# Patient Record
Sex: Female | Born: 1995 | Race: White | Hispanic: No | Marital: Single | Smoking: Never smoker
Health system: Southern US, Community
[De-identification: ages and names within clinical notes are randomized; demographics above are authoritative.]

## PROBLEM LIST (undated history)

## (undated) DIAGNOSIS — J020 Streptococcal pharyngitis: Secondary | ICD-10-CM

## (undated) DIAGNOSIS — R42 Dizziness and giddiness: Secondary | ICD-10-CM

## (undated) DIAGNOSIS — H8390 Unspecified disease of inner ear, unspecified ear: Secondary | ICD-10-CM

---
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
## 2017-01-16 ENCOUNTER — Emergency Department (HOSPITAL_BASED_OUTPATIENT_CLINIC_OR_DEPARTMENT_OTHER)
Admission: EM | Admit: 2017-01-16 | Discharge: 2017-01-16 | Disposition: A | Discharge status: Home / Self Care | Payer: BLUE CROSS/BLUE SHIELD | Attending: Emergency Medicine | Admitting: Emergency Medicine

## 2017-01-16 ENCOUNTER — Encounter (HOSPITAL_BASED_OUTPATIENT_CLINIC_OR_DEPARTMENT_OTHER): Payer: Self-pay | Admitting: *Deleted

## 2017-01-16 DIAGNOSIS — R6883 Chills (without fever): Secondary | ICD-10-CM | POA: Insufficient documentation

## 2017-01-16 DIAGNOSIS — R6889 Other general symptoms and signs: Secondary | ICD-10-CM

## 2017-01-16 DIAGNOSIS — R51 Headache: Secondary | ICD-10-CM | POA: Insufficient documentation

## 2017-01-16 LAB — URINALYSIS, ROUTINE W REFLEX MICROSCOPIC
Bilirubin Urine: NEGATIVE
GLUCOSE, UA: NEGATIVE mg/dL
Hgb urine dipstick: NEGATIVE
Ketones, ur: NEGATIVE mg/dL
Leukocytes, UA: NEGATIVE
Nitrite: NEGATIVE
Protein, ur: NEGATIVE mg/dL
Specific Gravity, Urine: 1.02 (ref 1.005–1.030)
pH: 6.5 (ref 5.0–8.0)

## 2017-01-16 LAB — PREGNANCY, URINE: PREG TEST UR: NEGATIVE

## 2017-01-16 MED ORDER — IBUPROFEN 800 MG PO TABS: 800.0000 mg | ORAL_TABLET | Freq: Three times a day (TID) | ORAL | 0 refills | Status: DC | PRN

## 2017-01-16 MED ORDER — ONDANSETRON 4 MG PO TBDP: 4.0000 mg | ORAL_TABLET | Freq: Three times a day (TID) | ORAL | 0 refills | Status: AC | PRN

## 2017-01-16 MED ORDER — OSELTAMIVIR PHOSPHATE 75 MG PO CAPS: 75.0000 mg | ORAL_CAPSULE | Freq: Two times a day (BID) | ORAL | 0 refills | Status: AC

## 2017-01-16 MED ORDER — IBUPROFEN 800 MG PO TABS
800.0000 mg | ORAL_TABLET | Freq: Once | ORAL | Status: AC
  Administered 2017-01-16: 800 mg via ORAL
  Filled 2017-01-16: qty 1

## 2017-01-16 NOTE — ED Provider Notes (Signed)
Emergency Department Provider Note   I have reviewed the triage vital signs and the nursing notes.   HISTORY  Chief Complaint Generalized Body Aches   HPI Brianna Watkins is a 21 y.o. female presents to the emergency department for evaluation of generalized body aches, headache, chills started today.  Earlier in the day she was having some right abdomen and flank discomfort but that resolved with Motrin.  Later in the afternoon/evening she began to feel suddenly much worse but denies return of pain in her abdomen or back.  She denies any dysuria, hesitancy, urgency.  She has some mild runny nose but no sore throat.  Her headache is diffuse and moderate.  No neck stiffness.  No productive cough or chest discomfort.   Past Medical History:  Diagnosis Date  . Disorder of inner ear   . Strep throat   . Vertigo     There are no active problems to display for this patient.   History reviewed. No pertinent surgical history.  Current Outpatient Rx  . Order #: [id number] Class: Print  . Order #: [id number] Class: Print  . Order #: [id number] Class: Print    Allergies Bactrim [sulfamethoxazole-trimethoprim]  No family history on file.  Social History Social History   Tobacco Use  . Smoking status: Never Smoker  . Smokeless tobacco: Never Used  Substance Use Topics  . Alcohol use: No  . Drug use: No    Review of Systems  Constitutional: No fever/chills. Positive body aches.  Eyes: No visual changes. ENT: No sore throat. Positive mild congestion.  Cardiovascular: Denies chest pain. Respiratory: Denies shortness of breath. Gastrointestinal: No abdominal pain.  No nausea, no vomiting.  No diarrhea.  No constipation. Genitourinary: Negative for dysuria. Positive flank pain earlier but now resolved.  Musculoskeletal: Negative for back pain. Skin: Negative for rash. Neurological: Negative for focal weakness or numbness. Positive HA.   10-point ROS otherwise  negative.  ____________________________________________   PHYSICAL EXAM:  VITAL SIGNS: ED Triage Vitals  Enc Vitals Group     BP 01/16/17 2027 130/84     Pulse Rate 01/16/17 2027 99     Resp 01/16/17 2027 18     Temp 01/16/17 2027 98.4 F (36.9 C)     Temp Source 01/16/17 2027 Oral     SpO2 01/16/17 2027 100 %     Weight 01/16/17 2024 150 lb (68 kg)     Height 01/16/17 2024 5\' 9"  (1.753 m)     Pain Score 01/16/17 2024 9    Constitutional: Alert and oriented. Well appearing and in no acute distress. Eyes: Conjunctivae are normal.  Head: Atraumatic. Nose: No congestion/rhinnorhea. Mouth/Throat: Mucous membranes are moist.  Oropharynx with mild erythema. No exudate. No PTA.  Neck: No stridor.  No meningeal signs.  Cardiovascular: Normal rate, regular rhythm. Good peripheral circulation. Grossly normal heart sounds.   Respiratory: Normal respiratory effort.  No retractions. Lungs CTAB. Gastrointestinal: Soft and nontender. No distention.  Musculoskeletal: No lower extremity tenderness nor edema. No gross deformities of extremities. Neurologic:  Normal speech and language. No gross focal neurologic deficits are appreciated.  Skin:  Skin is warm, dry and intact. No rash noted.  ____________________________________________   LABS (all labs ordered are listed, but only abnormal results are displayed)  Labs Reviewed  URINALYSIS, ROUTINE W REFLEX MICROSCOPIC  PREGNANCY, URINE   ____________________________________________  RADIOLOGY  None ____________________________________________   PROCEDURES  Procedure(s) performed:   Procedures  None ____________________________________________   INITIAL IMPRESSION / ASSESSMENT AND  PLAN / ED COURSE  Pertinent labs & imaging results that were available during my care of the patient were reviewed by me and considered in my medical decision making (see chart for details).  Patient presents emergency department for evaluation  of generalized body aches, headache, chills, and right abdomen/flank discomfort earlier today that resolved.  Plan for screening urinalysis but patient with no UTI symptoms other than flank pain earlier today.  She has had exposure to the flu as she works at [employer] day[employer] and several children were diagnosed recently.  Clinically this is suspicious for flu.  No meningismus or evidence of other serious bacterial infection.  Patient with no significant respiratory symptoms to necessitate chest x-ray at this time. If UA negative plan for Tamiflu.  Discussed the cost, risks, benefits, and expected course with the patient in detail.  At this time, I do not feel there is any life-threatening condition present. I have reviewed and discussed all results (EKG, imaging, lab, urine as appropriate), exam findings with patient. I have reviewed nursing notes and appropriate previous records.  I feel the patient is safe to be discharged home without further emergent workup. Discussed usual and customary return precautions. Patient and family (if present) verbalize understanding and are comfortable with this plan.  Patient will follow-up with their primary care provider. If they do not have a primary care provider, information for follow-up has been provided to them. All questions have been answered.  ____________________________________________  FINAL CLINICAL IMPRESSION(S) / ED DIAGNOSES  Final diagnoses:  Flu-like symptoms     MEDICATIONS GIVEN DURING THIS VISIT:  Medications  ibuprofen (ADVIL,MOTRIN) tablet 800 mg (800 mg Oral Given 01/16/17 2121)     NEW OUTPATIENT MEDICATIONS STARTED DURING THIS VISIT:  Tamiflu, Zofran, and Motrin 800 mg   Note:  This document was prepared using Dragon voice recognition software and may include unintentional dictation errors.  Alona BeneJoshua Long, MD Emergency Medicine    Long, Arlyss RepressJoshua G, MD 01/17/17 [phone]

## 2017-01-16 NOTE — ED Triage Notes (Signed)
C/o generalized body aches, ha, chills x 1 day   Took ibu 800 mg at 1000 this am

## 2017-01-16 NOTE — Discharge Instructions (Signed)

## 2017-01-17 ENCOUNTER — Encounter (HOSPITAL_BASED_OUTPATIENT_CLINIC_OR_DEPARTMENT_OTHER): Payer: Self-pay

## 2017-01-17 ENCOUNTER — Emergency Department (HOSPITAL_BASED_OUTPATIENT_CLINIC_OR_DEPARTMENT_OTHER)
Admission: EM | Admit: 2017-01-17 | Discharge: 2017-01-17 | Disposition: A | Discharge status: Home / Self Care | Payer: Self-pay | Attending: Emergency Medicine | Admitting: Emergency Medicine

## 2017-01-17 ENCOUNTER — Emergency Department (HOSPITAL_BASED_OUTPATIENT_CLINIC_OR_DEPARTMENT_OTHER): Payer: Self-pay

## 2017-01-17 ENCOUNTER — Other Ambulatory Visit: Payer: Self-pay

## 2017-01-17 DIAGNOSIS — R109 Unspecified abdominal pain: Secondary | ICD-10-CM | POA: Insufficient documentation

## 2017-01-17 DIAGNOSIS — J111 Influenza due to unidentified influenza virus with other respiratory manifestations: Secondary | ICD-10-CM | POA: Insufficient documentation

## 2017-01-17 DIAGNOSIS — R69 Illness, unspecified: Secondary | ICD-10-CM

## 2017-01-17 LAB — CBC WITH DIFFERENTIAL/PLATELET
BASOS PCT: 0 %
Basophils Absolute: 0 10*3/uL (ref 0.0–0.1)
Eosinophils Absolute: 0 10*3/uL (ref 0.0–0.7)
Eosinophils Relative: 0 %
HCT: 36.9 % (ref 36.0–46.0)
HEMOGLOBIN: 12.5 g/dL (ref 12.0–15.0)
LYMPHS PCT: 17 %
Lymphs Abs: 0.5 10*3/uL — ABNORMAL LOW (ref 0.7–4.0)
MCH: 28.5 pg (ref 26.0–34.0)
MCHC: 33.9 g/dL (ref 30.0–36.0)
MCV: 84.2 fL (ref 78.0–100.0)
MONO ABS: 0.5 10*3/uL (ref 0.1–1.0)
Monocytes Relative: 15 %
NEUTROS PCT: 68 %
Neutro Abs: 2.1 10*3/uL (ref 1.7–7.7)
Platelets: 183 10*3/uL (ref 150–400)
RBC: 4.38 MIL/uL (ref 3.87–5.11)
RDW: 12.8 % (ref 11.5–15.5)
WBC: 3.1 10*3/uL — ABNORMAL LOW (ref 4.0–10.5)

## 2017-01-17 LAB — COMPREHENSIVE METABOLIC PANEL
ALT: 23 U/L (ref 14–54)
AST: 32 U/L (ref 15–41)
Albumin: 4.2 g/dL (ref 3.5–5.0)
Alkaline Phosphatase: 53 U/L (ref 38–126)
Anion gap: 8 (ref 5–15)
BILIRUBIN TOTAL: 0.5 mg/dL (ref 0.3–1.2)
BUN: 9 mg/dL (ref 6–20)
CO2: 23 mmol/L (ref 22–32)
Calcium: 8.8 mg/dL — ABNORMAL LOW (ref 8.9–10.3)
Chloride: 105 mmol/L (ref 101–111)
Creatinine, Ser: 0.7 mg/dL (ref 0.44–1.00)
GFR calc Af Amer: >60 mL/min (ref >60)
Glucose, Bld: 106 mg/dL — ABNORMAL HIGH (ref 65–99)
Potassium: 3.6 mmol/L (ref 3.5–5.1)
Sodium: 136 mmol/L (ref 135–145)
Total Protein: 7.3 g/dL (ref 6.5–8.1)

## 2017-01-17 LAB — INFLUENZA PANEL BY PCR (TYPE A & B)
INFLAPCR: POSITIVE — AB
Influenza B By PCR: NEGATIVE

## 2017-01-17 LAB — RAPID STREP SCREEN (MED CTR MEBANE ONLY): Streptococcus, Group A Screen (Direct): NEGATIVE

## 2017-01-17 LAB — I-STAT CG4 LACTIC ACID, ED: Lactic Acid, Venous: 0.88 mmol/L (ref 0.5–1.9)

## 2017-01-17 MED ORDER — SODIUM CHLORIDE 0.9 % IV BOLUS (SEPSIS)
1000.0000 mL | Freq: Once | INTRAVENOUS | Status: AC
  Administered 2017-01-17: 1000 mL via INTRAVENOUS

## 2017-01-17 NOTE — ED Notes (Signed)
Pt not currently in room to be placed on monitor

## 2017-01-17 NOTE — ED Provider Notes (Signed)
Emergency Department Provider Note   I have reviewed the triage vital signs and the nursing notes.   HISTORY  Chief Complaint Generalized Body Aches   HPI Brianna Watkins is a 21 y.o. female with PMH of strep throat to the emergency department for evaluation of flulike symptoms now for the past 2 days.  Patient was seen in the emergency department yesterday with body aches, chills, and brief right sided pain.  Urinalysis was normal yesterday.  The patient has had close sick contacts including individuals diagnosed with flu.  She was prescribed Tamiflu and Motrin but when she went to the pharmacy the Tamiflu was very expensive and she was unable to start taking it.  She has been taking Tylenol and Motrin but today feels worse.  Patient states that she had more symptoms in 2017 which concerns her. No new symptoms such as worsening headache, worsening sore throat, or difficulty breathing.  No chest pain.  Patient did have a very brief sewed of right lower back discomfort but that resolved.  No abdominal pain.    Past Medical History:  Diagnosis Date  . Disorder of inner ear   . Strep throat   . Vertigo     There are no active problems to display for this patient.   History reviewed. No pertinent surgical history.  Current Outpatient Rx  . Order #: [id number] Class: Print  . Order #: [id number] Class: Print  . Order #: [id number] Class: Print    Allergies Bactrim [sulfamethoxazole-trimethoprim]  No family history on file.  Social History Social History   Tobacco Use  . Smoking status: Never Smoker  . Smokeless tobacco: Never Used  Substance Use Topics  . Alcohol use: No  . Drug use: No    Review of Systems  Constitutional: Positive fever/chills Eyes: No visual changes. ENT: Positive sore throat. Cardiovascular: Denies chest pain. Respiratory: Denies shortness of breath.  Gastrointestinal: No abdominal pain. No nausea, no vomiting.  No diarrhea.  No  constipation. Genitourinary: Negative for dysuria. Musculoskeletal: Positive brief episode of lower back pain.  Skin: Negative for rash. Neurological: Negative for focal weakness or numbness. Positive HA.   10-point ROS otherwise negative.  ____________________________________________   PHYSICAL EXAM:  VITAL SIGNS: ED Triage Vitals  Enc Vitals Group     BP 01/17/17 1542 124/76     Pulse Rate 01/17/17 1542 98     Resp 01/17/17 1542 16     Temp 01/17/17 1542 99.6 F (37.6 C)     Temp Source 01/17/17 1542 Oral     SpO2 01/17/17 1542 100 %     Weight 01/17/17 1542 143 lb 3.2 oz (65 kg)     Height 01/17/17 1542 5\' 9"  (1.753 m)     Pain Score 01/17/17 1543 10   Constitutional: Alert and oriented. Well appearing and in no acute distress. Eyes: Conjunctivae are normal.  Head: Atraumatic. Nose: No congestion/rhinnorhea. Mouth/Throat: Mucous membranes are moist.  Oropharynx is slightly erythematous without exudate or PTA.  Neck: No stridor.  No meningeal signs.  Cardiovascular: Normal rate, regular rhythm. Good peripheral circulation. Grossly normal heart sounds.   Respiratory: Normal respiratory effort.  No retractions. Lungs CTAB. Gastrointestinal: Soft and nontender. No distention.  Musculoskeletal: No lower extremity tenderness nor edema. No gross deformities of extremities. Neurologic:  Normal speech and language. No gross focal neurologic deficits are appreciated.  Skin:  Skin is warm, dry and intact. No rash noted.  ____________________________________________   LABS (all labs ordered are listed, but only  abnormal results are displayed)  Labs Reviewed  COMPREHENSIVE METABOLIC PANEL - Abnormal; Notable for the following components:      Result Value   Glucose, Bld 106 (*)    Calcium 8.8 (*)    All other components within normal limits  CBC WITH DIFFERENTIAL/PLATELET - Abnormal; Notable for the following components:   WBC 3.1 (*)    Lymphs Abs 0.5 (*)    All other  components within normal limits  RAPID STREP SCREEN (NOT AT [facility])  CULTURE, GROUP A STREP ([facility])  INFLUENZA PANEL BY PCR (TYPE A & B)  I-STAT CG4 LACTIC ACID, ED   ____________________________________________  RADIOLOGY  Dg Chest 2 View  Result Date: 01/17/2017 CLINICAL DATA:  Fever EXAM: CHEST  2 VIEW COMPARISON:  07/03/2015 FINDINGS: Normal heart size, mediastinal contours, and pulmonary vascularity. Lungs clear. No pleural effusion or pneumothorax. Bones unremarkable. IMPRESSION: Normal exam. Electronically Signed   By: Ulyses SouthwardMark  Boles M.D.   On: 01/17/2017 16:09   Ct Renal Stone Study  Result Date: 01/17/2017 CLINICAL DATA:  Right flank pain EXAM: CT ABDOMEN AND PELVIS WITHOUT CONTRAST TECHNIQUE: Multidetector CT imaging of the abdomen and pelvis was performed following the standard protocol without oral or intravenous contrast material administration. COMPARISON:  None. FINDINGS: Lower chest: Lung bases are clear. Hepatobiliary: No focal liver lesions are appreciable on this noncontrast enhanced study. Gallbladder wall is not appreciably thickened. There is no biliary duct dilatation. Pancreas: No pancreatic mass or inflammatory focus. Spleen: No splenic lesions are evident. Adrenals/Urinary Tract: Adrenals appear unremarkable bilaterally. There is slight nephrocalcinosis bilaterally. No renal mass or hydronephrosis is appreciable bilaterally. There is no well-defined renal or ureteral calculus on either side. Urinary bladder is midline with wall thickness within normal limits. Stomach/Bowel: There is moderate stool throughout the colon. There is no appreciable bowel wall or mesenteric thickening. There is no evident bowel obstruction. No free air or portal venous air. Vascular/Lymphatic: There is no abdominal aortic aneurysm. No vascular calcifications are evident. No adenopathy is appreciable in the abdomen or pelvis. Reproductive: Uterus is anteverted. There is no evident pelvic mass. Other:  Appendix appears normal. No abscess or ascites is evident in the abdomen or pelvis. Musculoskeletal: There are no blastic or lytic bone lesions. There is no intramuscular or abdominal wall lesion. IMPRESSION: 1. Slight nephrocalcinosis bilaterally without well-defined calculus in either kidney. No ureteral calculus. No hydronephrosis. 2.  No bowel obstruction.  No abscess.  Appendix appears normal. 3. A cause for patient's symptoms has not been established with this study. Electronically Signed   By: Bretta BangWilliam  Woodruff III M.D.   On: 01/17/2017 16:22    ____________________________________________   PROCEDURES  Procedure(s) performed:   Procedures  None ____________________________________________   INITIAL IMPRESSION / ASSESSMENT AND PLAN / ED COURSE  Pertinent labs & imaging results that were available during my care of the patient were reviewed by me and considered in my medical decision making (see chart for details).  Patient presents to the emergency department for evaluation of flulike symptoms continuing and now day 2.  Patient did not start Tamiflu prescribed yesterday.  She is well-appearing with normal vital signs.  No abdominal tenderness.  Plan for additional workup given her return visit.  Plan for IV fluids, labs, chest x-ray, rapid strep, and CT renal given family history of kidney stones and some intermittent (although mild/moderate severity) right flank pain. UA from yesterday with no blood or evidence of infection.   Normal imaging and labs. Feeling better after  IVF. Flu pending but advised patient this will not come back soon. She can follow up on MyChart but is almost outside of the window to benefit from Tamiflu. She was prescribed this along with Zofran yesterday. Provided contact information for PCP to establish care.   At this time, I do not feel there is any life-threatening condition present. I have reviewed and discussed all results (EKG, imaging, lab, urine as  appropriate), exam findings with patient. I have reviewed nursing notes and appropriate previous records.  I feel the patient is safe to be discharged home without further emergent workup. Discussed usual and customary return precautions. Patient and family (if present) verbalize understanding and are comfortable with this plan.  Patient will follow-up with their primary care provider. If they do not have a primary care provider, information for follow-up has been provided to them. All questions have been answered.  ____________________________________________  FINAL CLINICAL IMPRESSION(S) / ED DIAGNOSES  Final diagnoses:  Influenza-like illness     MEDICATIONS GIVEN DURING THIS VISIT:  Medications  sodium chloride 0.9 % bolus 1,000 mL (0 mLs Intravenous Stopped 01/17/17 1721)    Note:  This document was prepared using Dragon voice recognition software and may include unintentional dictation errors.  Alona Bene, MD Emergency Medicine    Long, Arlyss Repress, MD 01/17/17 [phone]

## 2017-01-17 NOTE — ED Notes (Addendum)
RN Hansel StarlingAdrienne collected influenza panel by PCR (type A & B) Screen and MD Long collected Rapid strep screen

## 2017-01-17 NOTE — ED Triage Notes (Addendum)
C/o flu like sx x 2 days-seen here last night for same-states she feels worse-NAD-steady gait-last motrin 220pm-did not get tamiflu rx filled due to cost

## 2017-01-17 NOTE — Discharge Instructions (Signed)

## 2017-01-20 LAB — CULTURE, GROUP A STREP (THRC)

## 2017-09-29 ENCOUNTER — Encounter (HOSPITAL_BASED_OUTPATIENT_CLINIC_OR_DEPARTMENT_OTHER): Payer: Self-pay

## 2017-09-29 ENCOUNTER — Emergency Department (HOSPITAL_BASED_OUTPATIENT_CLINIC_OR_DEPARTMENT_OTHER)
Admission: EM | Admit: 2017-09-29 | Discharge: 2017-09-30 | Disposition: A | Discharge status: Home / Self Care | Payer: BLUE CROSS/BLUE SHIELD | Attending: Emergency Medicine | Admitting: Emergency Medicine

## 2017-09-29 ENCOUNTER — Other Ambulatory Visit: Payer: Self-pay

## 2017-09-29 ENCOUNTER — Emergency Department (HOSPITAL_BASED_OUTPATIENT_CLINIC_OR_DEPARTMENT_OTHER): Payer: BLUE CROSS/BLUE SHIELD

## 2017-09-29 DIAGNOSIS — R079 Chest pain, unspecified: Secondary | ICD-10-CM | POA: Diagnosis present

## 2017-09-29 DIAGNOSIS — R0789 Other chest pain: Secondary | ICD-10-CM

## 2017-09-29 NOTE — ED Triage Notes (Addendum)
C/o CP x 1 week-states pain is worse with deep breaths-NAD-steady gait

## 2017-09-30 ENCOUNTER — Encounter (HOSPITAL_BASED_OUTPATIENT_CLINIC_OR_DEPARTMENT_OTHER): Payer: Self-pay | Admitting: Emergency Medicine

## 2017-09-30 LAB — BASIC METABOLIC PANEL
ANION GAP: 8 (ref 5–15)
BUN: 11 mg/dL (ref 6–20)
CO2: 25 mmol/L (ref 22–32)
Calcium: 8.8 mg/dL — ABNORMAL LOW (ref 8.9–10.3)
Chloride: 103 mmol/L (ref 98–111)
Creatinine, Ser: 0.8 mg/dL (ref 0.44–1.00)
GFR calc Af Amer: >60 mL/min (ref >60)
GFR calc non Af Amer: >60 mL/min (ref >60)
GLUCOSE: 95 mg/dL (ref 70–99)
POTASSIUM: 3.6 mmol/L (ref 3.5–5.1)
Sodium: 136 mmol/L (ref 135–145)

## 2017-09-30 LAB — CBC WITH DIFFERENTIAL/PLATELET
BASOS ABS: 0 10*3/uL (ref 0.0–0.1)
Basophils Relative: 0 %
Eosinophils Absolute: 0.1 10*3/uL (ref 0.0–0.7)
Eosinophils Relative: 2 %
HEMATOCRIT: 38.8 % (ref 36.0–46.0)
Hemoglobin: 13.3 g/dL (ref 12.0–15.0)
LYMPHS PCT: 22 %
Lymphs Abs: 1.3 10*3/uL (ref 0.7–4.0)
MCH: 29.4 pg (ref 26.0–34.0)
MCHC: 34.3 g/dL (ref 30.0–36.0)
MCV: 85.7 fL (ref 78.0–100.0)
Monocytes Absolute: 0.8 10*3/uL (ref 0.1–1.0)
Monocytes Relative: 13 %
NEUTROS ABS: 3.8 10*3/uL (ref 1.7–7.7)
Neutrophils Relative %: 63 %
Platelets: 208 10*3/uL (ref 150–400)
RBC: 4.53 MIL/uL (ref 3.87–5.11)
RDW: 12.6 % (ref 11.5–15.5)
WBC: 6.1 10*3/uL (ref 4.0–10.5)

## 2017-09-30 LAB — PREGNANCY, URINE: Preg Test, Ur: NEGATIVE

## 2017-09-30 LAB — D-DIMER, QUANTITATIVE: D-Dimer, Quant: 0.43 ug/mL-FEU (ref 0.00–0.50)

## 2017-09-30 MED ORDER — IBUPROFEN 800 MG PO TABS: 800.0000 mg | ORAL_TABLET | Freq: Three times a day (TID) | ORAL | 0 refills | Status: DC

## 2017-09-30 MED ORDER — KETOROLAC TROMETHAMINE 30 MG/ML IJ SOLN
15.0000 mg | Freq: Once | INTRAMUSCULAR | Status: AC
  Administered 2017-09-30: 15 mg via INTRAVENOUS
  Filled 2017-09-30: qty 1

## 2017-09-30 NOTE — ED Provider Notes (Signed)
[facility] EMERGENCY DEPARTMENT Provider Note   CSN: [id number] Arrival date & time: 09/29/17  2143     History   Chief Complaint Chief Complaint  Patient presents with  . Chest Pain    HPI Brianna Watkins is a 22 y.o. female.  The history is provided by the patient.  Chest Pain   This is a new problem. The current episode started more than 1 week ago (2 weeks). The problem occurs constantly. The problem has not changed since onset.The pain is associated with walking and movement. The pain is present in the lateral region. The pain is at a severity of 6/10. The pain is moderate. The quality of the pain is described as sharp. The pain does not radiate. Duration of episode(s) is 2 weeks. The symptoms are aggravated by certain positions. Pertinent negatives include no abdominal pain, no diaphoresis, no exertional chest pressure, no headaches, no hemoptysis, no irregular heartbeat, no leg pain, no lower extremity edema, no palpitations and no shortness of breath. She has tried nothing for the symptoms. The treatment provided no relief. Risk factors include oral contraceptive use.  Pertinent negatives for past medical history include no aneurysm, no Marfan's syndrome and no MI.  Pertinent negatives for family medical history include: no Marfan's syndrome.  Procedure history is negative for cardiac catheterization.  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  No DOE, no exertional symptoms.  No f/c/r. No leg pain or swelling.  No cough.    Past Medical History:  Diagnosis Date  . Disorder of inner ear   . Strep throat   . Vertigo     There are no active problems to display for this patient.   History reviewed. No pertinent surgical history.   OB History   None      Home Medications    Prior to Admission medications   Medication Sig Start Date End Date Taking? Authorizing Provider  ibuprofen (ADVIL,MOTRIN) 800  MG tablet Take 1 tablet (800 mg total) by mouth every 8 (eight) hours as needed. 01/16/17   Long, Arlyss RepressJoshua G, MD  ondansetron (ZOFRAN ODT) 4 MG disintegrating tablet Take 1 tablet (4 mg total) by mouth every 8 (eight) hours as needed for nausea or vomiting. 01/16/17   Long, Arlyss RepressJoshua G, MD  oseltamivir (TAMIFLU) 75 MG capsule Take 1 capsule (75 mg total) by mouth every 12 (twelve) hours. 01/16/17   Long, Arlyss RepressJoshua G, MD    Family History No family history on file.  Social History Social History   Tobacco Use  . Smoking status: Never Smoker  . Smokeless tobacco: Never Used  Substance Use Topics  . Alcohol use: Yes    Comment: occ  . Drug use: No     Allergies   Bactrim [sulfamethoxazole-trimethoprim]   Review of Systems Review of Systems  Constitutional: Negative for diaphoresis.  Respiratory: Negative for hemoptysis, chest tightness and shortness of breath.   Cardiovascular: Positive for chest pain. Negative for palpitations and leg swelling.  Gastrointestinal: Negative for abdominal pain.  Neurological: Negative for headaches.  All other systems reviewed and are negative.    Physical Exam Updated Vital Signs BP 117/89 (BP Location: Left Arm)   Pulse 78   Temp 98.3 F (36.8 C) (Oral)   Resp 18   LMP 09/07/2017   SpO2 98%   Physical Exam  Constitutional: She is oriented to person, place, and time. She appears well-developed and well-nourished.  No distress.  HENT:  Head: Normocephalic and atraumatic.  Mouth/Throat: Oropharynx is clear and moist. No oropharyngeal exudate.  Eyes: Pupils are equal, round, and reactive to light. Conjunctivae are normal.  Neck: Normal range of motion. Neck supple.  Cardiovascular: Normal rate, regular rhythm and normal heart sounds.  Pulmonary/Chest: Effort normal and breath sounds normal. No stridor. No respiratory distress. She has no wheezes. She has no rales.  Abdominal: Soft. Bowel sounds are normal. She exhibits no mass. There is no  tenderness. There is no rebound and no guarding.  Musculoskeletal: Normal range of motion. She exhibits no edema, tenderness or deformity.  Neurological: She is alert and oriented to person, place, and time. She displays normal reflexes.  Skin: Skin is warm and dry. Capillary refill takes less than 2 seconds.  Psychiatric: She has a normal mood and affect.     ED Treatments / Results  Labs (all labs ordered are listed, but only abnormal results are displayed) Results for orders placed or performed during the hospital encounter of 09/29/17  CBC with Differential/Platelet  Result Value Ref Range   WBC 6.1 4.0 - 10.5 K/uL   RBC 4.53 3.87 - 5.11 MIL/uL   Hemoglobin 13.3 12.0 - 15.0 g/dL   HCT 96.038.8 45.436.0 - 09.846.0 %   MCV 85.7 78.0 - 100.0 fL   MCH 29.4 26.0 - 34.0 pg   MCHC 34.3 30.0 - 36.0 g/dL   RDW 11.912.6 14.711.5 - 82.915.5 %   Platelets 208 150 - 400 K/uL   Neutrophils Relative % 63 %   Neutro Abs 3.8 1.7 - 7.7 K/uL   Lymphocytes Relative 22 %   Lymphs Abs 1.3 0.7 - 4.0 K/uL   Monocytes Relative 13 %   Monocytes Absolute 0.8 0.1 - 1.0 K/uL   Eosinophils Relative 2 %   Eosinophils Absolute 0.1 0.0 - 0.7 K/uL   Basophils Relative 0 %   Basophils Absolute 0.0 0.0 - 0.1 K/uL  Basic metabolic panel  Result Value Ref Range   Sodium 136 135 - 145 mmol/L   Potassium 3.6 3.5 - 5.1 mmol/L   Chloride 103 98 - 111 mmol/L   CO2 25 22 - 32 mmol/L   Glucose, Bld 95 70 - 99 mg/dL   BUN 11 6 - 20 mg/dL   Creatinine, Ser 5.620.80 0.44 - 1.00 mg/dL   Calcium 8.8 (L) 8.9 - 10.3 mg/dL   GFR calc non Af Amer >60 >60 mL/min   GFR calc Af Amer >60 >60 mL/min   Anion gap 8 5 - 15  D-dimer, quantitative (not at [facility])  Result Value Ref Range   D-Dimer, Quant 0.43 0.00 - 0.50 ug/mL-FEU  Pregnancy, urine  Result Value Ref Range   Preg Test, Ur NEGATIVE NEGATIVE   Dg Chest 2 View  Result Date: 09/29/2017 CLINICAL DATA:  Chest pain with deep breath EXAM: CHEST - 2 VIEW COMPARISON:  01/17/2017 FINDINGS: The  heart size and mediastinal contours are within normal limits. Both lungs are clear. The visualized skeletal structures are unremarkable. IMPRESSION: No active cardiopulmonary disease. Electronically Signed   By: Tollie Ethavid  Kwon M.D.   On: 09/29/2017 22:07    EKG EKG Interpretation  Date/Time:  Monday September 29 2017 21:49:16 EDT Ventricular Rate:  77 PR Interval:  126 QRS Duration: 72 QT Interval:  372 QTC Calculation: 420 R Axis:   87 Text Interpretation:  Normal sinus rhythm Confirmed by Nicanor AlconPalumbo, Skylan Gift ([id number]) on 09/29/2017 11:46:07 PM   Radiology Dg Chest 2  View  Result Date: 09/29/2017 CLINICAL DATA:  Chest pain with deep breath EXAM: CHEST - 2 VIEW COMPARISON:  01/17/2017 FINDINGS: The heart size and mediastinal contours are within normal limits. Both lungs are clear. The visualized skeletal structures are unremarkable. IMPRESSION: No active cardiopulmonary disease. Electronically Signed   By: Tollie Eth M.D.   On: 09/29/2017 22:07    Procedures Procedures (including critical care time)  Medications Ordered in ED Medications  ketorolac (TORADOL) 30 MG/ML injection 15 mg (15 mg Intravenous Given 09/30/17 0032)       Final Clinical Impressions(s) / ED Diagnoses   Chest pain with movement, given her work is consistent with [facility] pain.  Negative ddimer.  Highly doubt cardiac etiology. Will treat with NSAIds.      Return for fevers >100.4 unrelieved by medication, shortness of breath, intractable vomiting, or diarrhea, abdominal pain especially that localizes to the right lower quadrant, Inability to tolerate liquids or food, cough, altered mental status or any concerns. No signs of systemic illness or infection. The patient is nontoxic-appearing on exam and vital signs are within normal limits. Will refer to urology for microscopy hematuria as patient is asymptomatic.  I have reviewed the triage vital signs and the nursing notes. Pertinent labs &imaging results that were  available during my care of the patient were reviewed by me and considered in my medical decision making (see chart for details).  After history, exam, and medical workup I feel the patient has been appropriately medically screened and is safe for discharge home. Pertinent diagnoses were discussed with the patient. Patient was given return precautions.    Shean Gerding, MD 09/30/17 [id number]

## 2019-01-26 IMAGING — CT CT RENAL STONE PROTOCOL
2 of 4 series · 16 of 46 positions shown, 18 images · non-contrast
Comparison: None.

CLINICAL DATA: Right flank pain

EXAM:
CT ABDOMEN AND PELVIS WITHOUT CONTRAST
TECHNIQUE: Multidetector CT imaging of the abdomen and pelvis was performed
following the standard protocol without oral or intravenous contrast
material administration.

[Series 2: axial st · axial · 0.85mm/px · z∈[-478,-53]mm · 13 of 93 slices shown, 15 images]
[im 4/93  soft-tissue]
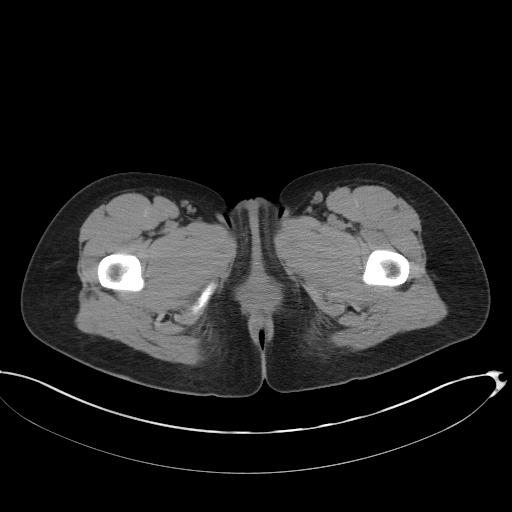
[im 4/93  bone]
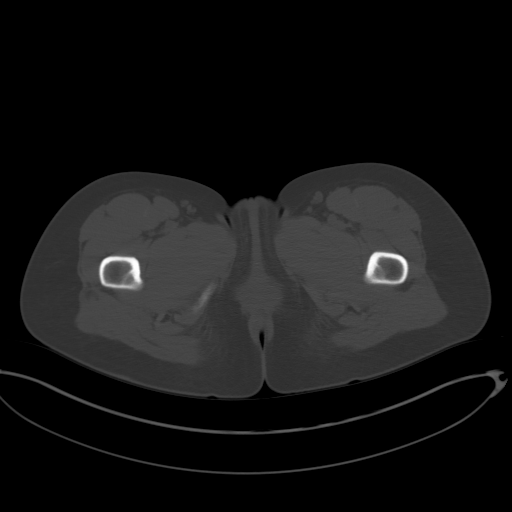
[im 12/93  soft-tissue]
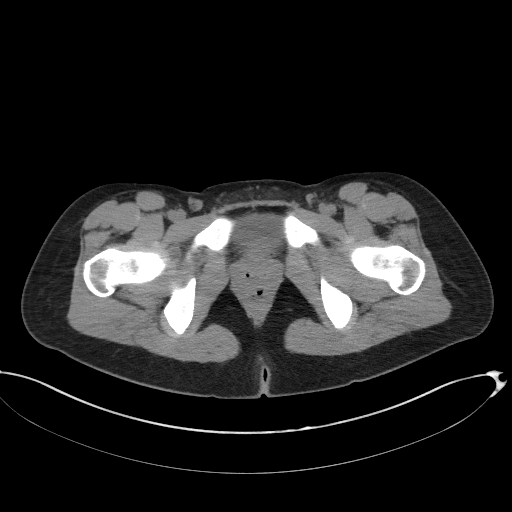
[im 20/93  soft-tissue]
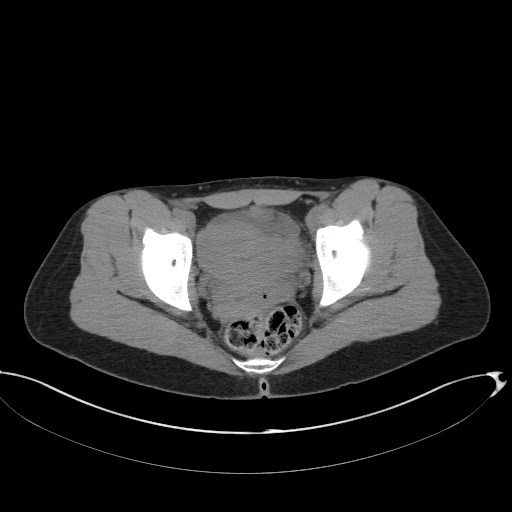
[im 27/93  soft-tissue]
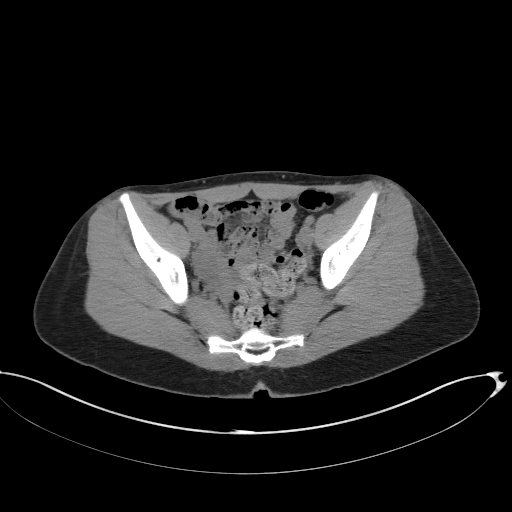
[im 31/93  soft-tissue]
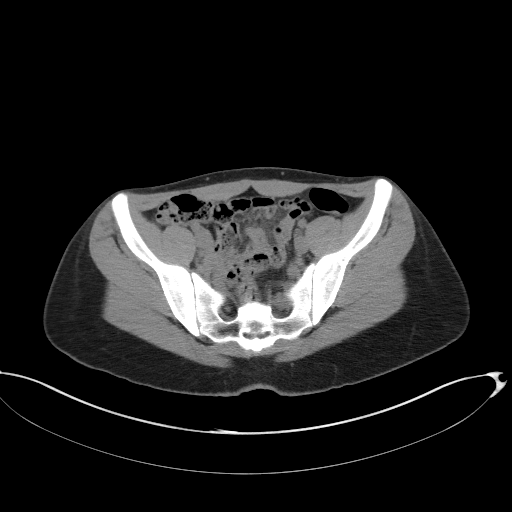
[im 39/93  soft-tissue]
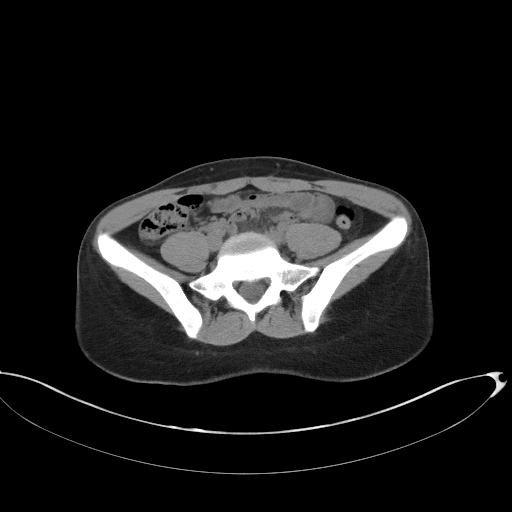
[im 47/93  soft-tissue]
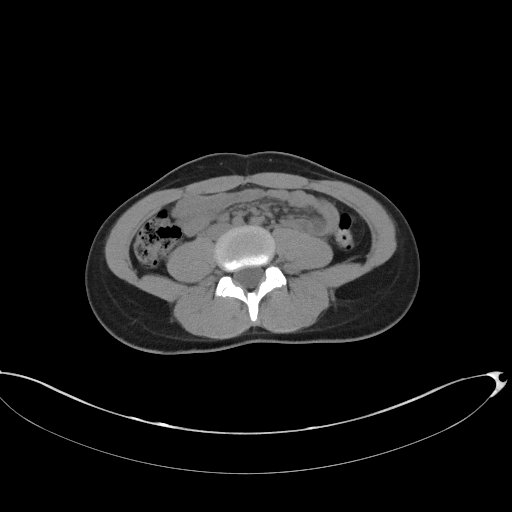
[im 54/93  soft-tissue]
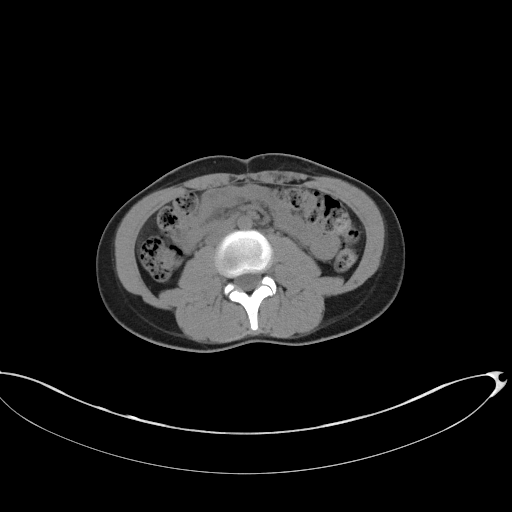
[im 62/93  soft-tissue]
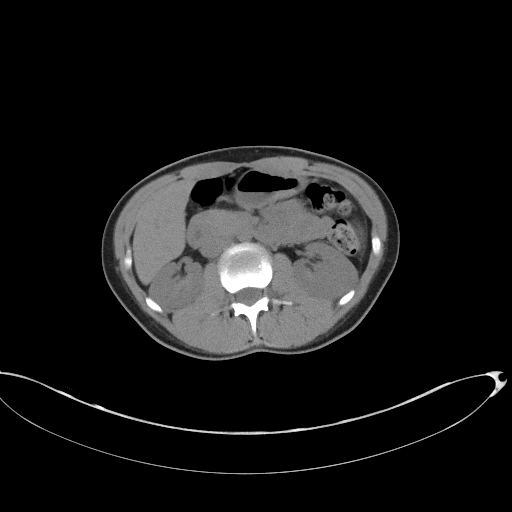
[im 62/93  bone]
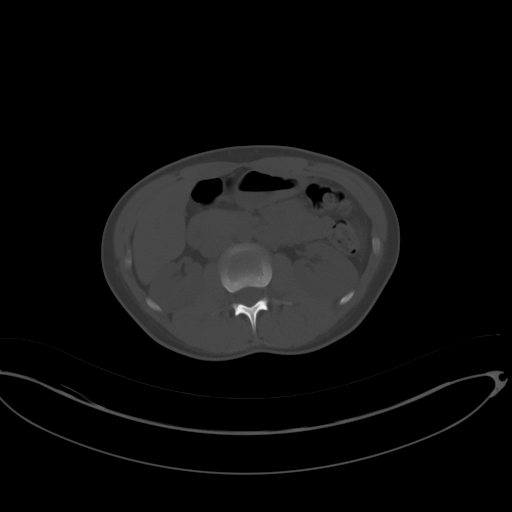
[im 66/93  soft-tissue]
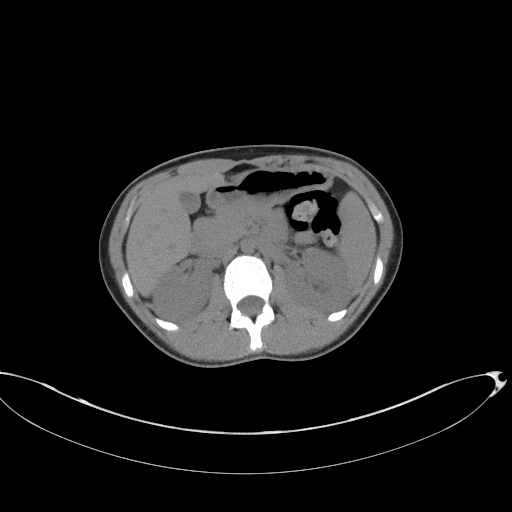
[im 73/93  soft-tissue]
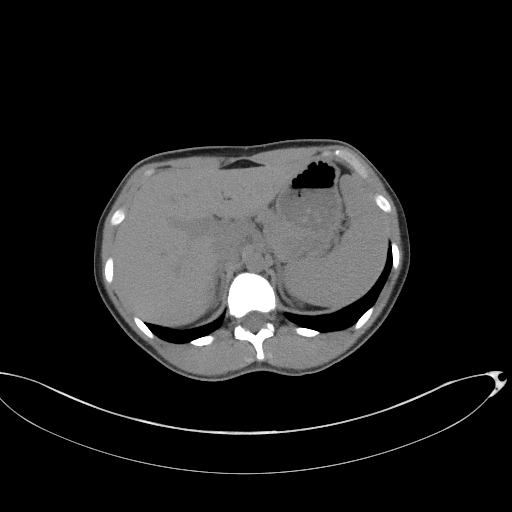
[im 81/93  soft-tissue]
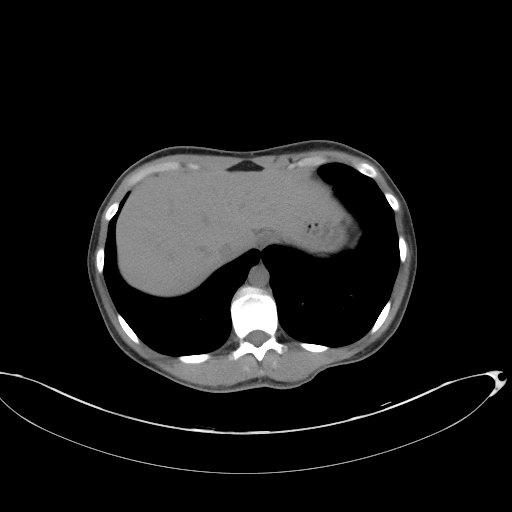
[im 89/93  soft-tissue]
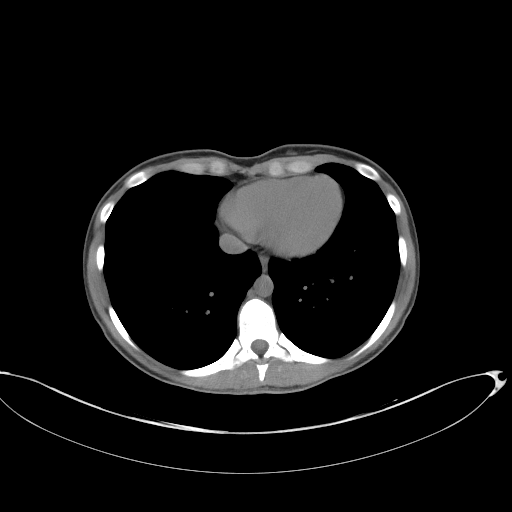

[Series 5: coronal st · coronal · 0.82mm/px · 3 of 70 slices shown]
[im 24/70  soft-tissue]
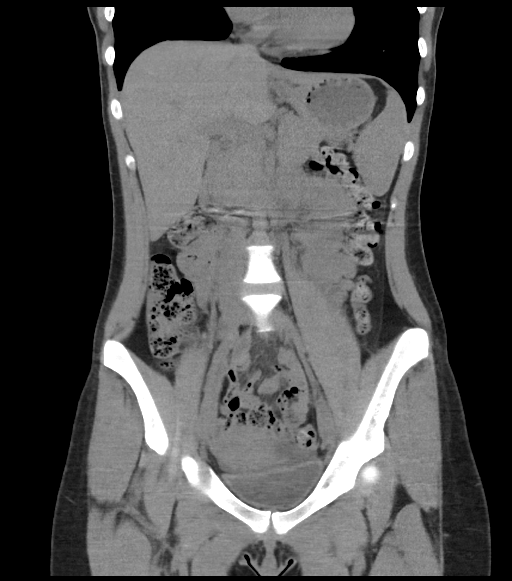
[im 31/70  soft-tissue]
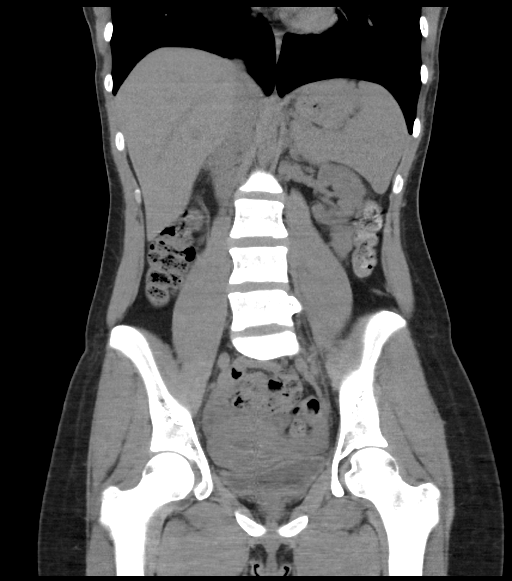
[im 39/70  soft-tissue]
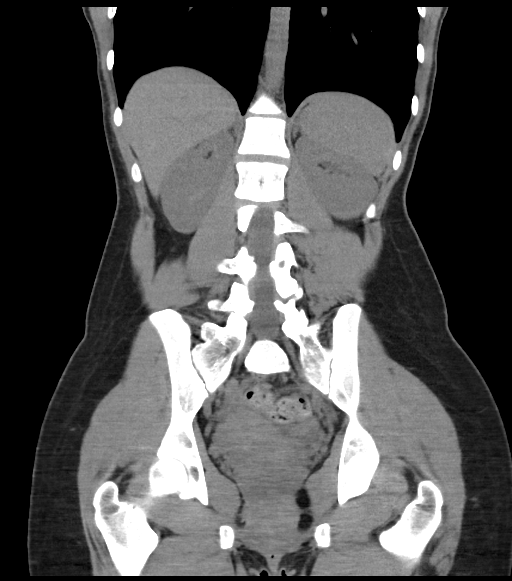

[16 of 46 positions shown; findings below may reference images not displayed]

FINDINGS: Lower chest: Lung bases are clear.

Hepatobiliary: No focal liver lesions are appreciable on this
noncontrast enhanced study. Gallbladder wall is not appreciably
thickened. There is no biliary duct dilatation.

Pancreas: No pancreatic mass or inflammatory focus.

Spleen: No splenic lesions are evident.

Adrenals/Urinary Tract: Adrenals appear unremarkable bilaterally.
There is slight nephrocalcinosis bilaterally. No renal mass or
hydronephrosis is appreciable bilaterally. There is no well-defined
renal or ureteral calculus on either side. Urinary bladder is
midline with wall thickness within normal limits.

Stomach/Bowel: There is moderate stool throughout the colon. There
is no appreciable bowel wall or mesenteric thickening. There is no
evident bowel obstruction. No free air or portal venous air.

Vascular/Lymphatic: There is no abdominal aortic aneurysm. No
vascular calcifications are evident. No adenopathy is appreciable in
the abdomen or pelvis.

Reproductive: Uterus is anteverted. There is no evident pelvic mass.

Other: Appendix appears normal. No abscess or ascites is evident in
the abdomen or pelvis.

Musculoskeletal: There are no blastic or lytic bone lesions. There
is no intramuscular or abdominal wall lesion.
IMPRESSION: 1. Slight nephrocalcinosis bilaterally without well-defined calculus
in either kidney. No ureteral calculus. No hydronephrosis.

2.  No bowel obstruction.  No abscess.  Appendix appears normal.

3. A cause for patient's symptoms has not been established with this
study.

## 2019-12-11 ENCOUNTER — Encounter (HOSPITAL_BASED_OUTPATIENT_CLINIC_OR_DEPARTMENT_OTHER): Payer: Self-pay | Admitting: Emergency Medicine

## 2019-12-11 ENCOUNTER — Other Ambulatory Visit: Payer: Self-pay

## 2019-12-11 ENCOUNTER — Emergency Department (HOSPITAL_BASED_OUTPATIENT_CLINIC_OR_DEPARTMENT_OTHER)
Admission: EM | Admit: 2019-12-11 | Discharge: 2019-12-11 | Disposition: A | Discharge status: Home / Self Care | Payer: BLUE CROSS/BLUE SHIELD | Attending: Emergency Medicine | Admitting: Emergency Medicine

## 2019-12-11 DIAGNOSIS — L0291 Cutaneous abscess, unspecified: Secondary | ICD-10-CM

## 2019-12-11 DIAGNOSIS — N907 Vulvar cyst: Secondary | ICD-10-CM | POA: Insufficient documentation

## 2019-12-11 MED ORDER — OXYCODONE-ACETAMINOPHEN 5-325 MG PO TABS
1.0000 | ORAL_TABLET | ORAL | Status: DC | PRN
  Administered 2019-12-11: 1 via ORAL
  Filled 2019-12-11: qty 1

## 2019-12-11 MED ORDER — LIDOCAINE HCL 4 % EX SOLN: CUTANEOUS | 0 refills | Status: AC | PRN

## 2019-12-11 NOTE — ED Notes (Signed)
Went to primary MD yesterday, was told she has a cyst like at here vaginal area. Is scheduled to see her GYN MD on Tuesday. Last pm began having more pain and has noted an increase size, very sensitive and painful to the point she is unable to wear undergarments. Was medicated for pain by triage RN. Has some relief from pain med

## 2019-12-11 NOTE — Discharge Instructions (Addendum)
Lidocaine aqueous 4%. Recent excellent research shows that this is highly effective in treating pain. You can apply it be soaking 1-2 cotton balls and then placing the cotton ball at the vaginal opening for 2-5 minutes.  -Continue warm soaks.  You can do this up to 4 times a day.  After you finish soaking you should dry the area with a hair dryer on the cool setting.  -Do not wear any tight fitting clothing.  As much as possible you should try to begin loosefitting shorts.  -Do not shave the area  Keep follow-up appointment with your GYN for reassessment.  Return to the emergency department for any new or worsening symptoms.

## 2019-12-11 NOTE — ED Provider Notes (Signed)
[facility] EMERGENCY DEPARTMENT Provider Note   CSN: [id number] Arrival date & time: 12/11/19  1205     History Chief Complaint  Patient presents with  . Cyst    Brianna Watkins is a 24 y.o. female with noncontributory past medical history.  HPI Presents to emergency department today with chief complaint of cyst x3 days.  Patient states she noticed a cyst on her clitoris.  She states it has progressively gotten bigger since symptom onset.  It is tender to the touch.  She saw her PCP for yesterday and was prescribed tramadol and clindamycin.  She has not yet started taking the medications.  Today the pain worsened prompting her to come to the emergency room for further evaluation.  She does have a GYN appointment scheduled in 3 days for further evaluation of the cyst.  Patient states she has been trying warm soaks at home.  She is describing the pain as aching.  Pain is localized to the cyst.  She rates the pain 10 of 10 in severity.  She is unable to lay down comfortably or ambulate without pain.  She does endorse dysuria, she had a UA negative for UTI at PCP office yesterday.  She does shave the area and has a history of boils she says.  She denies any fevers, chills, pelvic pain, nausea, emesis, rash, diarhea.  Currently on menses cycle.  She was last sexually active 1 week ago.  She only has 1 female partner.  She had recent negative STD testing as well.  She states while waiting in the lobby she felt like the cyst burst.  It had not been draining prior to arrival.    Past Medical History:  Diagnosis Date  . Disorder of inner ear   . Strep throat   . Vertigo     There are no problems to display for this patient.   History reviewed. No pertinent surgical history.   OB History   No obstetric history on file.     No family history on file.  Social History   Tobacco Use  . Smoking status: Never Smoker  . Smokeless tobacco: Never Used  Vaping Use  . Vaping Use:  Never used  Substance Use Topics  . Alcohol use: Yes    Comment: occ  . Drug use: No    Home Medications Prior to Admission medications   Medication Sig Start Date End Date Taking? Authorizing Provider  ibuprofen (ADVIL,MOTRIN) 800 MG tablet Take 1 tablet (800 mg total) by mouth every 8 (eight) hours as needed. 01/16/17   Long, Arlyss Repress, MD  ibuprofen (ADVIL,MOTRIN) 800 MG tablet Take 1 tablet (800 mg total) by mouth 3 (three) times daily. 09/30/17   Palumbo, April, MD  lidocaine (XYLOCAINE) 4 % external solution Apply topically as needed. 12/11/19   Jolly Bleicher E, PA-C  ondansetron (ZOFRAN ODT) 4 MG disintegrating tablet Take 1 tablet (4 mg total) by mouth every 8 (eight) hours as needed for nausea or vomiting. 01/16/17   Long, Arlyss Repress, MD  oseltamivir (TAMIFLU) 75 MG capsule Take 1 capsule (75 mg total) by mouth every 12 (twelve) hours. 01/16/17   Long, Arlyss Repress, MD    Allergies    Bactrim [sulfamethoxazole-trimethoprim]  Review of Systems   Review of Systems All other systems are reviewed and are negative for acute change except as noted in the HPI.  Physical Exam Updated Vital Signs BP 114/73 (BP Location: Right Arm)   Pulse (!) 109  Temp 98.8 F (37.1 C) (Oral)   Resp (!) 22   Ht 5\' 7"  (1.702 m)   Wt 67.1 kg   LMP 12/11/2019   SpO2 100%   BMI 23.18 kg/m   Physical Exam Vitals and nursing note reviewed.  Constitutional:      General: She is not in acute distress.    Appearance: She is not ill-appearing.  HENT:     Head: Normocephalic and atraumatic.     Right Ear: Tympanic membrane and external ear normal.     Left Ear: Tympanic membrane and external ear normal.     Nose: Nose normal.     Mouth/Throat:     Mouth: Mucous membranes are moist.     Pharynx: Oropharynx is clear.  Eyes:     General: No scleral icterus.       Right eye: No discharge.        Left eye: No discharge.     Extraocular Movements: Extraocular movements intact.     Conjunctiva/sclera:  Conjunctivae normal.     Pupils: Pupils are equal, round, and reactive to light.  Neck:     Vascular: No JVD.  Cardiovascular:     Rate and Rhythm: Normal rate and regular rhythm.     Pulses: Normal pulses.          Radial pulses are 2+ on the right side and 2+ on the left side.     Heart sounds: Normal heart sounds.     Comments: Heart rate in the 90s during exam. Pulmonary:     Comments: Lungs clear to auscultation in all fields. Symmetric chest rise. No wheezing, rales, or rhonchi. Abdominal:     Comments: Abdomen is soft, non-distended, and non-tender in all quadrants. No rigidity, no guarding. No peritoneal signs.  Genitourinary:    Comments: 2 x 2 centimeter erythematous cyst on clitoral hood with purulent drainage. Exquisitely tender to palpation.  Musculoskeletal:        General: Normal range of motion.     Cervical back: Normal range of motion.  Skin:    General: Skin is warm and dry.     Capillary Refill: Capillary refill takes less than 2 seconds.  Neurological:     Mental Status: She is oriented to person, place, and time.     GCS: GCS eye subscore is 4. GCS verbal subscore is 5. GCS motor subscore is 6.     Comments: Fluent speech, no facial droop.  Psychiatric:        Behavior: Behavior normal.     ED Results / Procedures / Treatments   Labs (all labs ordered are listed, but only abnormal results are displayed) Labs Reviewed - No data to display  EKG None  Radiology No results found.  Procedures Procedures (including critical care time)  Medications Ordered in ED Medications  oxyCODONE-acetaminophen (PERCOCET/ROXICET) 5-325 MG per tablet 1 tablet (1 tablet Oral Given 12/11/19 1229)    ED Course  I have reviewed the triage vital signs and the nursing notes.  Pertinent labs & imaging results that were available during my care of the patient were reviewed by me and considered in my medical decision making (see chart for details).    MDM  Rules/Calculators/A&P                          History provided by patient with additional history obtained from chart review.    24 year old female presenting with cyst.  She is well-appearing, no acute distress.  She does appear uncomfortable.  She has noted to have a heart rate of 109 in triage, during exam heart rate was in the 90s, tachycardia resolved.  She was given pain medicine in triage with symptom improvement. She has a cyst on her clitoral hood that is actively draining copius purulent discharge.  She was already prescribed antibiotics and pain medicine by her PCP.  No signs of ulceration or rash. Consulted [facility] APP who agrees with plan to continue antibiotics and follow-up with her GYN.  Advised to continue warm soaks to 4 times daily as well as applying Xylocaine for pain as well.  Patient advised to wear loosefitting clothing. Patient agrees with plan of care.  The patient appears reasonably screened and/or stabilized for discharge and I doubt any other medical condition or other [facility] requiring further screening, evaluation, or treatment in the ED at this time prior to discharge. The patient is safe for discharge with strict return precautions discussed.    Portions of this note were generated with Scientist, clinical (histocompatibility and immunogenetics). Dictation errors may occur despite best attempts at proofreading.    Final Clinical Impression(s) / ED Diagnoses Final diagnoses:  Abscess    Rx / DC Orders ED Discharge Orders         Ordered    lidocaine (XYLOCAINE) 4 % external solution  As needed        12/11/19 1639           Renley Banwart, Caroleen Hamman, PA-C 12/11/19 1733    Virgina Norfolk, DO 12/11/19 1823

## 2019-12-11 NOTE — ED Triage Notes (Signed)
Pt reports cyst on clitoris area x 3 days.

## 2023-05-12 ENCOUNTER — Encounter (HOSPITAL_BASED_OUTPATIENT_CLINIC_OR_DEPARTMENT_OTHER): Payer: Self-pay | Admitting: Emergency Medicine

## 2023-05-12 ENCOUNTER — Emergency Department (HOSPITAL_BASED_OUTPATIENT_CLINIC_OR_DEPARTMENT_OTHER)
Admission: EM | Admit: 2023-05-12 | Discharge: 2023-05-13 | Disposition: A | Discharge status: Home / Self Care | Attending: Emergency Medicine | Admitting: Emergency Medicine

## 2023-05-12 ENCOUNTER — Emergency Department (HOSPITAL_BASED_OUTPATIENT_CLINIC_OR_DEPARTMENT_OTHER)

## 2023-05-12 ENCOUNTER — Emergency Department (HOSPITAL_BASED_OUTPATIENT_CLINIC_OR_DEPARTMENT_OTHER): Admitting: Radiology

## 2023-05-12 DIAGNOSIS — S0990XA Unspecified injury of head, initial encounter: Secondary | ICD-10-CM | POA: Insufficient documentation

## 2023-05-12 DIAGNOSIS — Z23 Encounter for immunization: Secondary | ICD-10-CM | POA: Insufficient documentation

## 2023-05-12 DIAGNOSIS — M795 Residual foreign body in soft tissue: Secondary | ICD-10-CM | POA: Diagnosis not present

## 2023-05-12 DIAGNOSIS — Y92410 Unspecified street and highway as the place of occurrence of the external cause: Secondary | ICD-10-CM | POA: Insufficient documentation

## 2023-05-12 DIAGNOSIS — S51012A Laceration without foreign body of left elbow, initial encounter: Secondary | ICD-10-CM | POA: Insufficient documentation

## 2023-05-12 DIAGNOSIS — S50902A Unspecified superficial injury of left elbow, initial encounter: Secondary | ICD-10-CM | POA: Diagnosis present

## 2023-05-12 LAB — PREGNANCY, URINE: Preg Test, Ur: NEGATIVE

## 2023-05-12 MED ORDER — OXYCODONE-ACETAMINOPHEN 5-325 MG PO TABS
1.0000 | ORAL_TABLET | Freq: Once | ORAL | Status: DC
  Filled 2023-05-12: qty 1

## 2023-05-12 MED ORDER — TETANUS-DIPHTH-ACELL PERTUSSIS 5-2.5-18.5 LF-MCG/0.5 IM SUSY
0.5000 mL | PREFILLED_SYRINGE | Freq: Once | INTRAMUSCULAR | Status: AC
  Administered 2023-05-12: 0.5 mL via INTRAMUSCULAR
  Filled 2023-05-12: qty 0.5

## 2023-05-12 MED ORDER — CYCLOBENZAPRINE HCL 10 MG PO TABS: 10.0000 mg | ORAL_TABLET | Freq: Two times a day (BID) | ORAL | 0 refills | Status: AC | PRN

## 2023-05-12 MED ORDER — IBUPROFEN 600 MG PO TABS: 600.0000 mg | ORAL_TABLET | Freq: Four times a day (QID) | ORAL | 0 refills | Status: AC | PRN

## 2023-05-12 MED ORDER — IBUPROFEN 800 MG PO TABS
800.0000 mg | ORAL_TABLET | Freq: Once | ORAL | Status: AC
  Administered 2023-05-12: 800 mg via ORAL
  Filled 2023-05-12: qty 1

## 2023-05-12 MED ORDER — LIDOCAINE-EPINEPHRINE (PF) 2 %-1:200000 IJ SOLN
10.0000 mL | Freq: Once | INTRAMUSCULAR | Status: AC
  Administered 2023-05-12: 10 mL via INTRADERMAL
  Filled 2023-05-12: qty 20
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
## 2023-05-12 NOTE — Discharge Instructions (Signed)
 You have been evaluated for your symptoms.  You suffered a laceration to your left elbow.  There is a small shards of glasses that were noted on x-rays.  I have irrigated your wound and remove the glass but there is always a chance of small retaining foreign body which usually will work itself out over time.  Please have your sutures removed in 1 week.  Take medication prescribed as needed for pain.  CT scan of your head did not show anything concerning finding.
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
